# Patient Record
Sex: Female | Born: 1945 | Race: White | Hispanic: No | Smoking: Never smoker
Health system: Southern US, Community
[De-identification: ages and names within clinical notes are randomized; demographics above are authoritative.]

## PROBLEM LIST (undated history)

## (undated) DIAGNOSIS — I10 Essential (primary) hypertension: Secondary | ICD-10-CM

## (undated) HISTORY — PX: ABDOMINAL HYSTERECTOMY: SHX81

## (undated) HISTORY — PX: TUBAL LIGATION: SHX77

---

## 2004-10-23 ENCOUNTER — Ambulatory Visit (HOSPITAL_COMMUNITY): Admission: RE | Admit: 2004-10-23 | Discharge: 2004-10-23 | Payer: Self-pay | Admitting: Family Medicine

## 2004-12-05 ENCOUNTER — Ambulatory Visit (HOSPITAL_COMMUNITY): Admission: RE | Admit: 2004-12-05 | Discharge: 2004-12-05 | Payer: Self-pay

## 2005-03-29 ENCOUNTER — Ambulatory Visit (HOSPITAL_COMMUNITY): Admission: RE | Admit: 2005-03-29 | Discharge: 2005-03-29 | Payer: Self-pay | Admitting: Family Medicine

## 2009-08-13 ENCOUNTER — Emergency Department (HOSPITAL_COMMUNITY): Admission: EM | Admit: 2009-08-13 | Discharge: 2009-08-13 | Payer: Self-pay | Admitting: Emergency Medicine

## 2009-12-08 ENCOUNTER — Encounter: Admission: RE | Admit: 2009-12-08 | Discharge: 2009-12-08 | Payer: Self-pay | Admitting: Internal Medicine

## 2011-01-09 ENCOUNTER — Other Ambulatory Visit: Payer: Self-pay | Admitting: Internal Medicine

## 2011-01-09 DIAGNOSIS — Z1231 Encounter for screening mammogram for malignant neoplasm of breast: Secondary | ICD-10-CM

## 2011-01-19 ENCOUNTER — Ambulatory Visit
Admission: RE | Admit: 2011-01-19 | Discharge: 2011-01-19 | Disposition: A | Discharge status: Home / Self Care | Payer: BC Managed Care – PPO | Source: Ambulatory Visit | Attending: Internal Medicine | Admitting: Internal Medicine

## 2011-01-19 DIAGNOSIS — Z1231 Encounter for screening mammogram for malignant neoplasm of breast: Secondary | ICD-10-CM

## 2012-03-21 ENCOUNTER — Other Ambulatory Visit: Payer: Self-pay | Admitting: Internal Medicine

## 2012-03-21 DIAGNOSIS — Z1231 Encounter for screening mammogram for malignant neoplasm of breast: Secondary | ICD-10-CM

## 2012-04-08 ENCOUNTER — Ambulatory Visit: Payer: BC Managed Care – PPO

## 2012-04-17 ENCOUNTER — Ambulatory Visit
Admission: RE | Admit: 2012-04-17 | Discharge: 2012-04-17 | Disposition: A | Discharge status: Home / Self Care | Payer: BC Managed Care – PPO | Source: Ambulatory Visit | Attending: Internal Medicine | Admitting: Internal Medicine

## 2012-04-17 DIAGNOSIS — Z1231 Encounter for screening mammogram for malignant neoplasm of breast: Secondary | ICD-10-CM

## 2013-04-21 ENCOUNTER — Encounter (INDEPENDENT_AMBULATORY_CARE_PROVIDER_SITE_OTHER): Payer: Self-pay | Admitting: *Deleted

## 2013-06-17 ENCOUNTER — Other Ambulatory Visit: Payer: Self-pay

## 2013-06-17 DIAGNOSIS — Z1231 Encounter for screening mammogram for malignant neoplasm of breast: Secondary | ICD-10-CM

## 2013-07-23 ENCOUNTER — Ambulatory Visit
Admission: RE | Admit: 2013-07-23 | Discharge: 2013-07-23 | Disposition: A | Discharge status: Home / Self Care | Payer: Medicare Other | Source: Ambulatory Visit

## 2013-07-23 DIAGNOSIS — Z1231 Encounter for screening mammogram for malignant neoplasm of breast: Secondary | ICD-10-CM

## 2014-07-21 ENCOUNTER — Other Ambulatory Visit: Payer: Self-pay

## 2014-07-21 DIAGNOSIS — Z1231 Encounter for screening mammogram for malignant neoplasm of breast: Secondary | ICD-10-CM

## 2014-07-29 ENCOUNTER — Ambulatory Visit
Admission: RE | Admit: 2014-07-29 | Discharge: 2014-07-29 | Disposition: A | Discharge status: Home / Self Care | Payer: PPO | Source: Ambulatory Visit

## 2014-07-29 ENCOUNTER — Encounter (INDEPENDENT_AMBULATORY_CARE_PROVIDER_SITE_OTHER): Payer: Self-pay

## 2014-07-29 DIAGNOSIS — Z1231 Encounter for screening mammogram for malignant neoplasm of breast: Secondary | ICD-10-CM

## 2015-01-03 ENCOUNTER — Other Ambulatory Visit: Payer: Self-pay

## 2015-02-11 ENCOUNTER — Encounter (HOSPITAL_COMMUNITY): Payer: Self-pay | Admitting: Emergency Medicine

## 2015-02-11 ENCOUNTER — Emergency Department (HOSPITAL_COMMUNITY): Payer: PPO

## 2015-02-11 ENCOUNTER — Emergency Department (HOSPITAL_COMMUNITY)
Admission: EM | Admit: 2015-02-11 | Discharge: 2015-02-11 | Disposition: A | Discharge status: Home / Self Care | Payer: PPO | Attending: Emergency Medicine | Admitting: Emergency Medicine

## 2015-02-11 DIAGNOSIS — Y9289 Other specified places as the place of occurrence of the external cause: Secondary | ICD-10-CM | POA: Insufficient documentation

## 2015-02-11 DIAGNOSIS — S93402A Sprain of unspecified ligament of left ankle, initial encounter: Secondary | ICD-10-CM

## 2015-02-11 DIAGNOSIS — W1839XA Other fall on same level, initial encounter: Secondary | ICD-10-CM | POA: Insufficient documentation

## 2015-02-11 DIAGNOSIS — I10 Essential (primary) hypertension: Secondary | ICD-10-CM | POA: Diagnosis not present

## 2015-02-11 DIAGNOSIS — S99912A Unspecified injury of left ankle, initial encounter: Secondary | ICD-10-CM | POA: Diagnosis present

## 2015-02-11 DIAGNOSIS — Y998 Other external cause status: Secondary | ICD-10-CM | POA: Diagnosis not present

## 2015-02-11 DIAGNOSIS — Y9389 Activity, other specified: Secondary | ICD-10-CM | POA: Insufficient documentation

## 2015-02-11 HISTORY — DX: Essential (primary) hypertension: I10

## 2015-02-11 NOTE — Discharge Instructions (Signed)
Ankle Sprain °An ankle sprain is an injury to the strong, fibrous tissues (ligaments) that hold the bones of your ankle joint together.  °CAUSES °An ankle sprain is usually caused by a fall or by twisting your ankle. Ankle sprains most commonly occur when you step on the outer edge of your foot, and your ankle turns inward. People who participate in sports are more prone to these types of injuries.  °SYMPTOMS  °· Pain in your ankle. The pain may be present at rest or only when you are trying to stand or walk. °· Swelling. °· Bruising. Bruising may develop immediately or within 1 to 2 days after your injury. °· Difficulty standing or walking, particularly when turning corners or changing directions. °DIAGNOSIS  °Your caregiver will ask you details about your injury and perform a physical exam of your ankle to determine if you have an ankle sprain. During the physical exam, your caregiver will press on and apply pressure to specific areas of your foot and ankle. Your caregiver will try to move your ankle in certain ways. An X-ray exam may be done to be sure a bone was not broken or a ligament did not separate from one of the bones in your ankle (avulsion fracture).  °TREATMENT  °Certain types of braces can help stabilize your ankle. Your caregiver can make a recommendation for this. Your caregiver may recommend the use of medicine for pain. If your sprain is severe, your caregiver may refer you to a surgeon who helps to restore function to parts of your skeletal system (orthopedist) or a physical therapist. °HOME CARE INSTRUCTIONS  °· Apply ice to your injury for 1-2 days or as directed by your caregiver. Applying ice helps to reduce inflammation and pain. °¨ Put ice in a plastic bag. °¨ Place a towel between your skin and the bag. °¨ Leave the ice on for 15-20 minutes at a time, every 2 hours while you are awake. °· Only take over-the-counter or prescription medicines for pain, discomfort, or fever as directed by  your caregiver. °· Elevate your injured ankle above the level of your heart as much as possible for 2-3 days. °· If your caregiver recommends crutches, use them as instructed. Gradually put weight on the affected ankle. Continue to use crutches or a cane until you can walk without feeling pain in your ankle. °· If you have a plaster splint, wear the splint as directed by your caregiver. Do not rest it on anything harder than a pillow for the first 24 hours. Do not put weight on it. Do not get it wet. You may take it off to take a shower or bath. °· You may have been given an elastic bandage to wear around your ankle to provide support. If the elastic bandage is too tight (you have numbness or tingling in your foot or your foot becomes cold and blue), adjust the bandage to make it comfortable. °· If you have an air splint, you may blow more air into it or let air out to make it more comfortable. You may take your splint off at night and before taking a shower or bath. Wiggle your toes in the splint several times per day to decrease swelling. °SEEK MEDICAL CARE IF:  °· You have rapidly increasing bruising or swelling. °· Your toes feel extremely cold or you lose feeling in your foot. °· Your pain is not relieved with medicine. °SEEK IMMEDIATE MEDICAL CARE IF: °· Your toes are numb or blue. °·   You have severe pain that is increasing. MAKE SURE YOU:   Understand these instructions.  Will watch your condition.  Will get help right away if you are not doing well or get worse. Document Released: 06/25/2005 Document Revised: 03/19/2012 Document Reviewed: 07/07/2011 [facility] Patient Information 2015 [place], [place]. This information is not intended to replace advice given to you by your health care provider. Make sure you discuss any questions you have with your health care provider.   Wear the ASO and minimize weight bearing for the next several days.  Use ice and elevation as much as possible for the next  several days to help reduce the swelling.  Call the orthopedic doctor listed for a recheck of your injury if not improving over the next 10 days.  You may benefit from physical therapy of your ankle if your injury is not improving by then.  You could also see your primary doctor for a recheck of this as well.

## 2015-02-11 NOTE — ED Notes (Signed)
Pt states that she rolled her left ankle last night causing her to fall.  Denies any complaint other than left ankle.

## 2015-02-13 NOTE — ED Provider Notes (Signed)
CSN: [id number]     Arrival date & time 02/11/15  1742 History   First MD Initiated Contact with Patient 02/11/15 1754     Chief Complaint  Patient presents with  . Ankle Pain     (Consider location/radiation/quality/duration/timing/severity/associated sxs/prior Treatment) Patient is a 69 y.o. female presenting with ankle pain. The history is provided by the patient.  Ankle Pain Location:  Ankle Time since incident:  1 day Injury: yes   Mechanism of injury comment:  She rolled her left ankle while ambulating last night. Ankle location:  L ankle Pain details:    Quality:  Aching and throbbing   Radiates to:  Does not radiate   Severity:  Moderate   Onset quality:  Sudden   Duration:  1 day   Timing:  Constant   Progression:  Unchanged Chronicity:  New Dislocation: no   Foreign body present:  No foreign bodies Prior injury to area:  No Worsened by:  Bearing weight and activity Ineffective treatments:  Rest Associated symptoms: swelling   Associated symptoms: no back pain, no numbness and no tingling     Past Medical History  Diagnosis Date  . Hypertension    Past Surgical History  Procedure Laterality Date  . Tubal ligation    . Abdominal hysterectomy     History reviewed. No pertinent family history. History  Substance Use Topics  . Smoking status: Never Smoker   . Smokeless tobacco: Not on file  . Alcohol Use: No   OB History    Gravida Para Term Preterm AB TAB SAB Ectopic Multiple Living   Review of Systems  Musculoskeletal: Positive for joint swelling and arthralgias. Negative for back pain.  Skin: Negative for wound.  Neurological: Negative for weakness and numbness.      Allergies  Codeine  Home Medications   Prior to Admission medications   Not on File   BP 180/78 mmHg  Pulse 82  Temp(Src) 98 F (36.7 C) (Oral)  Resp 18  Ht  (1.626 m)  Wt 165 lb (74.844 kg)  BMI 28.31 kg/m2  SpO2 100% Physical Exam   Constitutional: She appears well-developed and well-nourished.  HENT:  Head: Normocephalic.  Cardiovascular: Normal rate and intact distal pulses.  Exam reveals no decreased pulses.   Pulses:      Dorsalis pedis pulses are 2+ on the right side, and 2+ on the left side.       Posterior tibial pulses are 2+ on the right side, and 2+ on the left side.  Musculoskeletal:       Left ankle: She exhibits decreased range of motion and swelling. She exhibits no ecchymosis, no deformity, no laceration and normal pulse. Tenderness. Lateral malleolus tenderness found. No head of 5th metatarsal and no proximal fibula tenderness found. Achilles tendon normal.  Neurological: She is alert. No sensory deficit.  Skin: Skin is warm, dry and intact.  Nursing note and vitals reviewed.   ED Course  Procedures (including critical care time) Labs Review Labs Reviewed - No data to display  Imaging Review No results found.   EKG Interpretation None      MDM   Final diagnoses:  Ankle sprain, left, initial encounter    Patients labs and/or radiological studies were reviewed and considered during the medical decision making and disposition process. Imaging was reviewed, interpreted and I agree with radiologists reading. Results were also discussed with patient.  Pt was placed in an aso, advised RICE, offered crutches, but pt states she did not do well on them the last time she needed them, was interested in a walker, prescription given for this.  Referral to ortho for recheck if sx are not improving with tx over the next 10 days.  The patient appears reasonably screened and/or stabilized for discharge and I doubt any other medical condition or other [facility] requiring further screening, evaluation, or treatment in the ED at this time prior to discharge.      Burgess Amor, PA-C 02/13/15 2256  Rolland Porter, MD 02/23/15 [phone]

## 2015-07-28 ENCOUNTER — Other Ambulatory Visit: Payer: Self-pay

## 2015-07-28 DIAGNOSIS — Z1231 Encounter for screening mammogram for malignant neoplasm of breast: Secondary | ICD-10-CM

## 2015-08-09 ENCOUNTER — Ambulatory Visit
Admission: RE | Admit: 2015-08-09 | Discharge: 2015-08-09 | Disposition: A | Discharge status: Home / Self Care | Payer: PPO | Source: Ambulatory Visit

## 2015-08-09 DIAGNOSIS — Z1231 Encounter for screening mammogram for malignant neoplasm of breast: Secondary | ICD-10-CM

## 2016-10-09 ENCOUNTER — Other Ambulatory Visit: Payer: Self-pay | Admitting: Internal Medicine

## 2016-10-09 DIAGNOSIS — Z1231 Encounter for screening mammogram for malignant neoplasm of breast: Secondary | ICD-10-CM

## 2016-10-15 ENCOUNTER — Ambulatory Visit: Payer: PPO

## 2016-11-30 ENCOUNTER — Ambulatory Visit
Admission: RE | Admit: 2016-11-30 | Discharge: 2016-11-30 | Disposition: A | Discharge status: Home / Self Care | Payer: Medicare Other | Source: Ambulatory Visit | Attending: Internal Medicine | Admitting: Internal Medicine

## 2016-11-30 DIAGNOSIS — Z1231 Encounter for screening mammogram for malignant neoplasm of breast: Secondary | ICD-10-CM

## 2017-08-22 ENCOUNTER — Other Ambulatory Visit: Payer: Self-pay | Admitting: Internal Medicine

## 2017-08-22 DIAGNOSIS — Z1231 Encounter for screening mammogram for malignant neoplasm of breast: Secondary | ICD-10-CM

## 2017-12-06 ENCOUNTER — Ambulatory Visit: Payer: Medicare Other

## 2018-02-10 ENCOUNTER — Ambulatory Visit: Payer: Medicare Other

## 2018-03-17 ENCOUNTER — Ambulatory Visit
Admission: RE | Admit: 2018-03-17 | Discharge: 2018-03-17 | Disposition: A | Discharge status: Home / Self Care | Payer: Medicare Other | Source: Ambulatory Visit | Attending: Internal Medicine | Admitting: Internal Medicine

## 2018-03-17 DIAGNOSIS — Z1231 Encounter for screening mammogram for malignant neoplasm of breast: Secondary | ICD-10-CM

## 2019-06-28 IMAGING — MG DIGITAL SCREENING BILATERAL MAMMOGRAM WITH TOMO AND CAD
8 series · 8 of 24 positions shown · non-contrast
Comparison: Previous exam(s).

CLINICAL DATA: Screening.

EXAM:
DIGITAL SCREENING BILATERAL MAMMOGRAM WITH TOMO AND CAD

[L CC synth-2D]
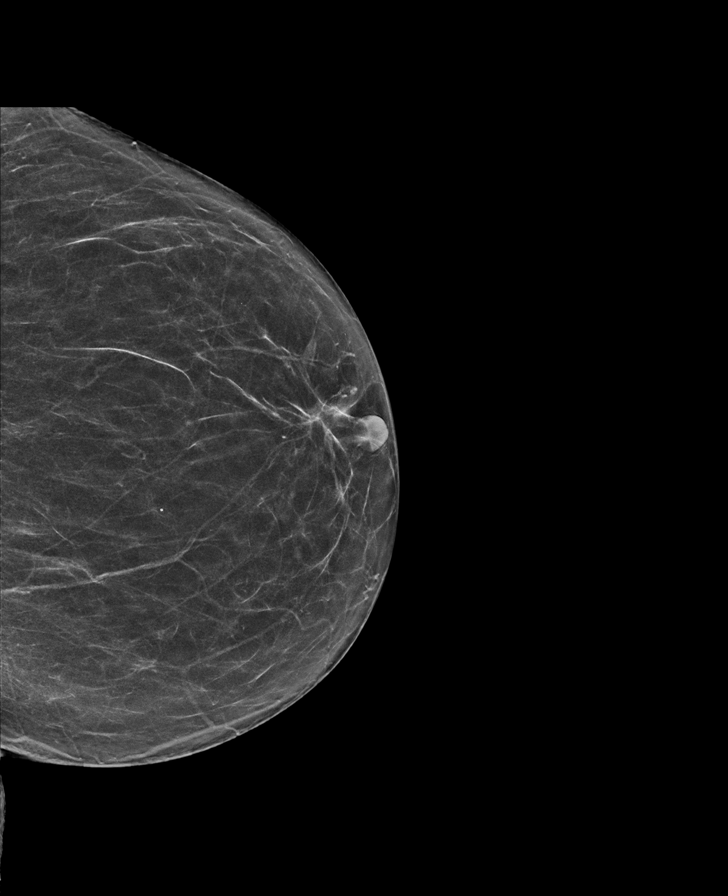

[R CC synth-2D]
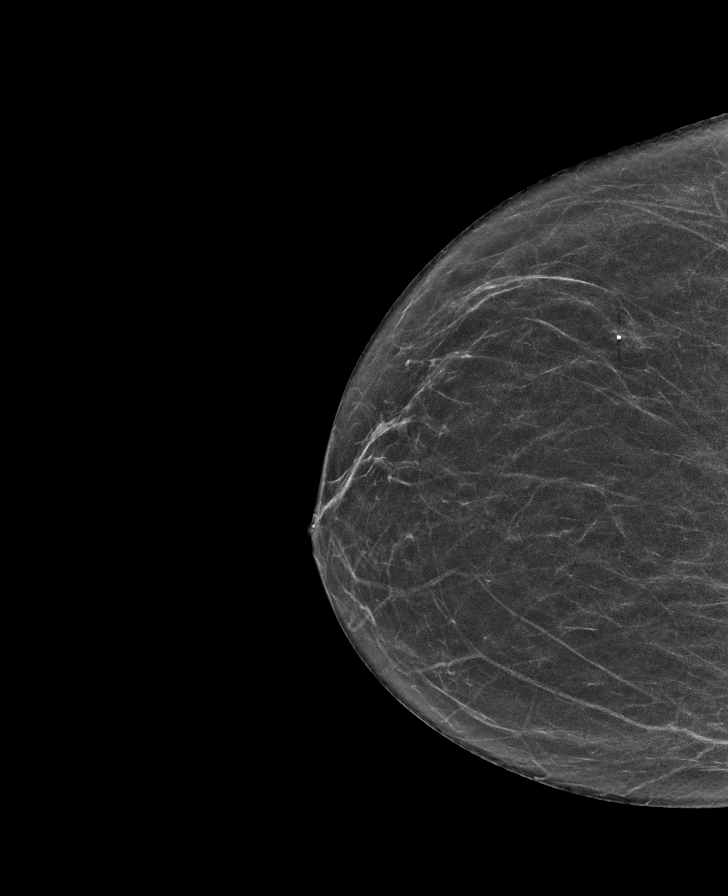

[L MLO synth-2D]
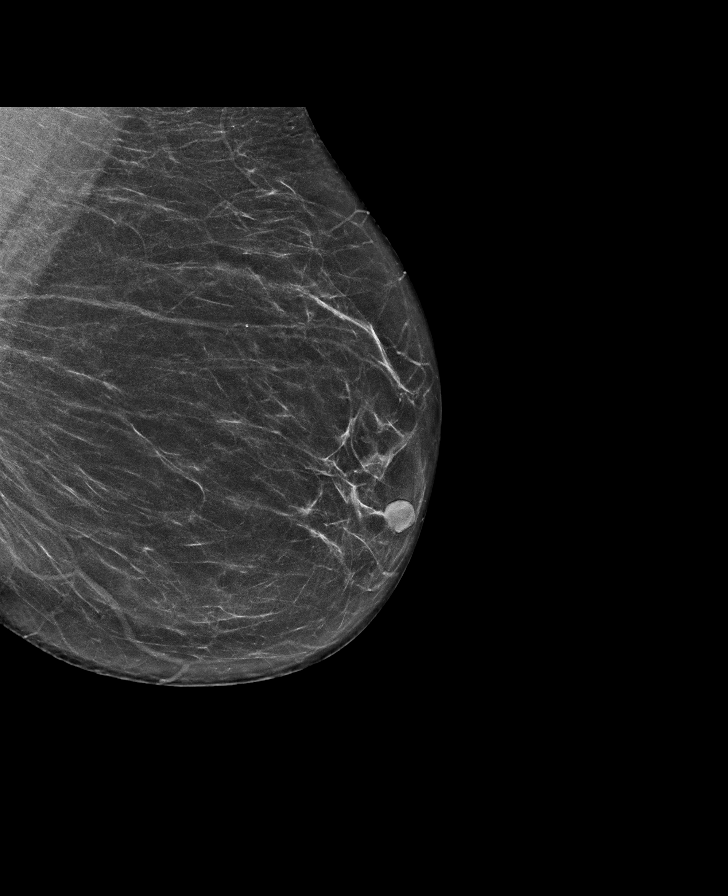

[R MLO synth-2D]
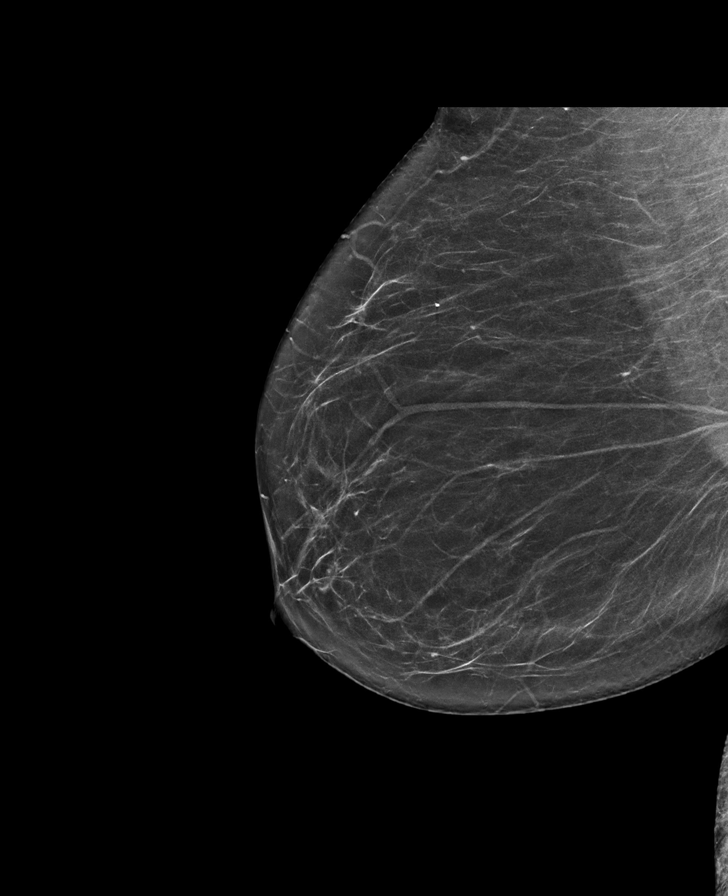

[L CC tomo · tomo slice 31/60.0]
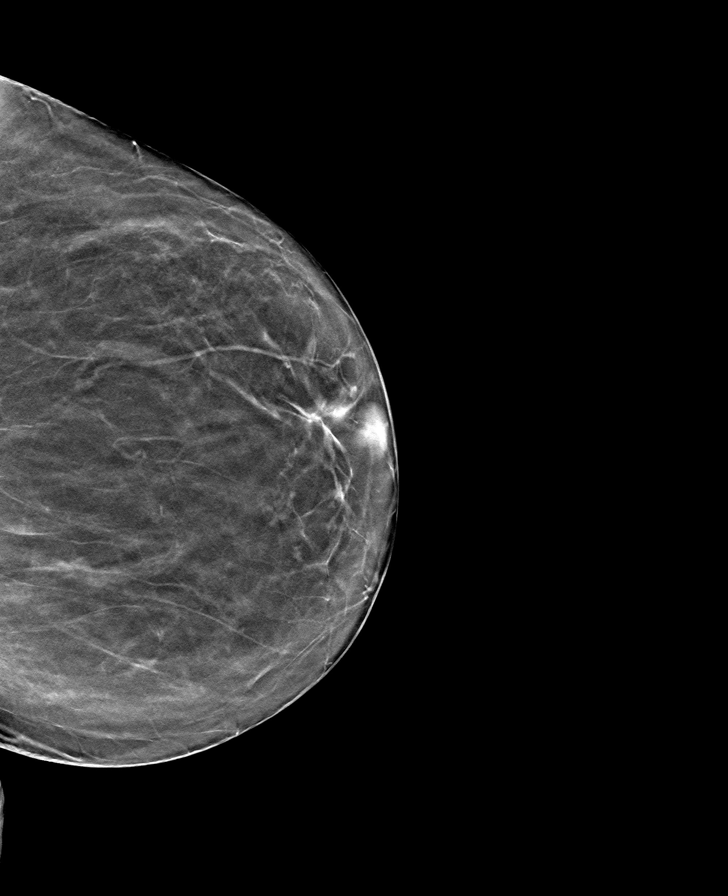

[L MLO tomo · tomo slice 35/68.0]
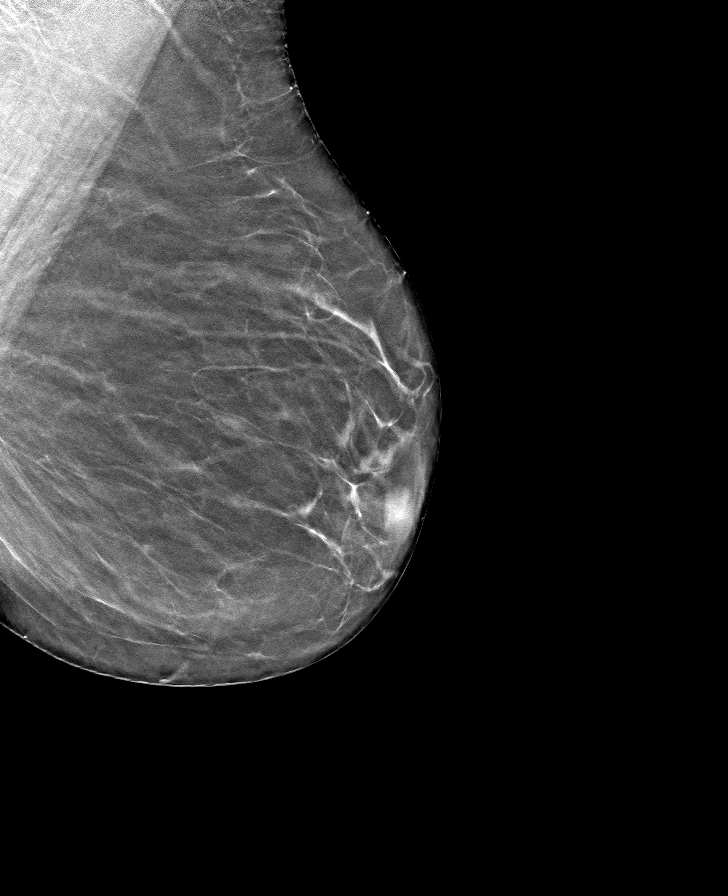

[R MLO tomo · tomo slice 33/66.0]
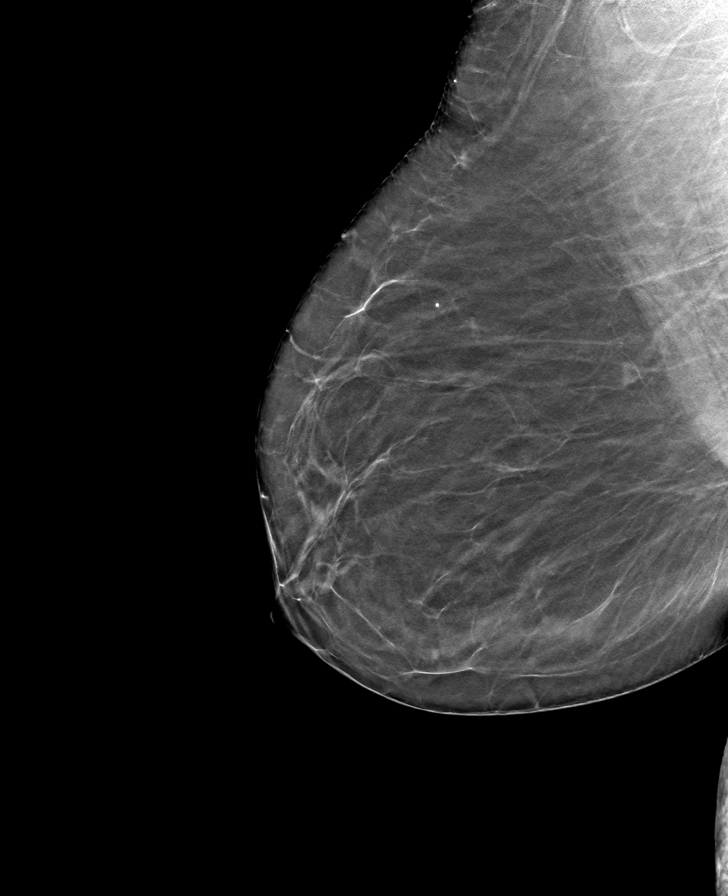

[R CC tomo · tomo slice 29/56.0]
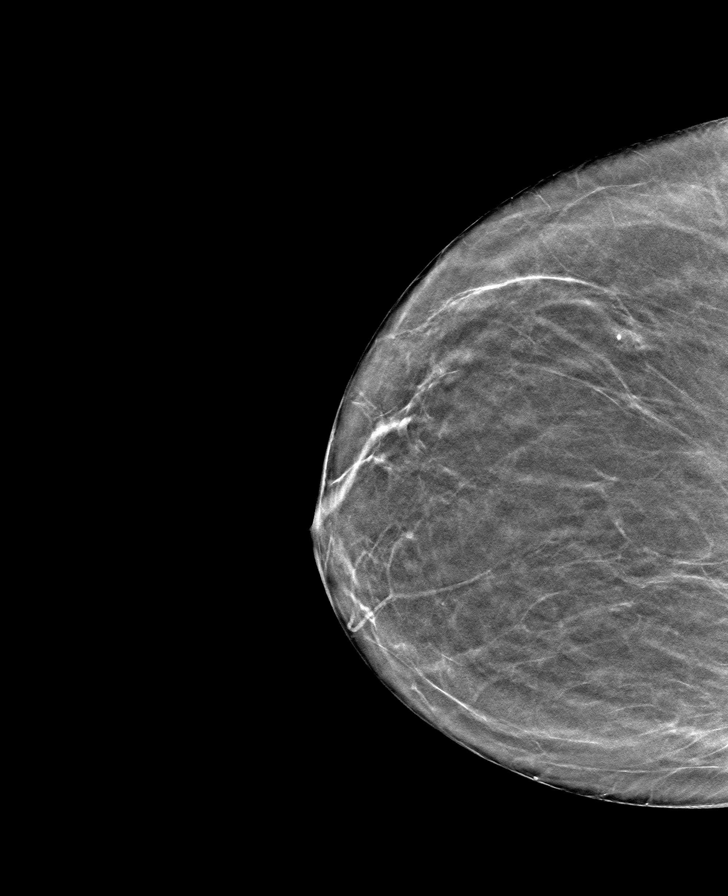

[8 of 24 positions shown; findings below may reference images not displayed]

ACR Breast Density Category b: There are scattered areas of
fibroglandular density.
FINDINGS: There are no findings suspicious for malignancy. Images were
processed with CAD.
IMPRESSION: No mammographic evidence of malignancy. A result letter of this
screening mammogram will be mailed directly to the patient.

RECOMMENDATION:
Screening mammogram in one year. (Code:[id number])

BI-RADS CATEGORY  1: Negative.
